# Patient Record
Sex: Female | Born: 1960 | Race: White | Hispanic: No | Smoking: Never smoker
Health system: Southern US, Community
[De-identification: ages and names within clinical notes are randomized; demographics above are authoritative.]

## PROBLEM LIST (undated history)

## (undated) DIAGNOSIS — R51 Headache: Secondary | ICD-10-CM

## (undated) DIAGNOSIS — I10 Essential (primary) hypertension: Secondary | ICD-10-CM

## (undated) DIAGNOSIS — E785 Hyperlipidemia, unspecified: Secondary | ICD-10-CM

## (undated) DIAGNOSIS — K219 Gastro-esophageal reflux disease without esophagitis: Secondary | ICD-10-CM

## (undated) DIAGNOSIS — M1990 Unspecified osteoarthritis, unspecified site: Secondary | ICD-10-CM

## (undated) HISTORY — PX: WISDOM TOOTH EXTRACTION: SHX21

## (undated) HISTORY — PX: EYE SURGERY: SHX253

---

## 1997-12-10 ENCOUNTER — Other Ambulatory Visit: Admission: RE | Admit: 1997-12-10 | Discharge: 1997-12-10 | Payer: Self-pay | Admitting: *Deleted

## 1997-12-12 ENCOUNTER — Other Ambulatory Visit: Admission: RE | Admit: 1997-12-12 | Discharge: 1997-12-12 | Payer: Self-pay | Admitting: *Deleted

## 1999-02-04 ENCOUNTER — Other Ambulatory Visit: Admission: RE | Admit: 1999-02-04 | Discharge: 1999-02-04 | Payer: Self-pay | Admitting: *Deleted

## 2000-08-19 ENCOUNTER — Other Ambulatory Visit: Admission: RE | Admit: 2000-08-19 | Discharge: 2000-08-19 | Payer: Self-pay | Admitting: Obstetrics and Gynecology

## 2000-12-01 ENCOUNTER — Ambulatory Visit (HOSPITAL_COMMUNITY): Admission: RE | Admit: 2000-12-01 | Discharge: 2000-12-01 | Payer: Self-pay | Admitting: Obstetrics and Gynecology

## 2000-12-01 ENCOUNTER — Encounter: Payer: Self-pay | Admitting: Obstetrics and Gynecology

## 2000-12-07 ENCOUNTER — Other Ambulatory Visit: Admission: RE | Admit: 2000-12-07 | Discharge: 2000-12-07 | Payer: Self-pay | Admitting: Obstetrics and Gynecology

## 2000-12-07 ENCOUNTER — Encounter (INDEPENDENT_AMBULATORY_CARE_PROVIDER_SITE_OTHER): Payer: Self-pay

## 2002-03-09 ENCOUNTER — Other Ambulatory Visit: Admission: RE | Admit: 2002-03-09 | Discharge: 2002-03-09 | Payer: Self-pay | Admitting: Obstetrics and Gynecology

## 2003-03-15 ENCOUNTER — Other Ambulatory Visit: Admission: RE | Admit: 2003-03-15 | Discharge: 2003-03-15 | Payer: Self-pay | Admitting: Obstetrics and Gynecology

## 2004-04-03 ENCOUNTER — Other Ambulatory Visit: Admission: RE | Admit: 2004-04-03 | Discharge: 2004-04-03 | Payer: Self-pay | Admitting: Obstetrics and Gynecology

## 2005-04-09 ENCOUNTER — Other Ambulatory Visit: Admission: RE | Admit: 2005-04-09 | Discharge: 2005-04-09 | Payer: Self-pay | Admitting: Obstetrics and Gynecology

## 2006-06-03 ENCOUNTER — Other Ambulatory Visit: Admission: RE | Admit: 2006-06-03 | Discharge: 2006-06-03 | Payer: Self-pay | Admitting: Obstetrics and Gynecology

## 2009-10-02 ENCOUNTER — Encounter: Admission: RE | Admit: 2009-10-02 | Discharge: 2009-10-02 | Payer: Self-pay | Admitting: Obstetrics and Gynecology

## 2010-11-23 ENCOUNTER — Other Ambulatory Visit: Payer: Self-pay | Admitting: Obstetrics and Gynecology

## 2010-11-23 DIAGNOSIS — Z1231 Encounter for screening mammogram for malignant neoplasm of breast: Secondary | ICD-10-CM

## 2010-12-01 ENCOUNTER — Ambulatory Visit
Admission: RE | Admit: 2010-12-01 | Discharge: 2010-12-01 | Disposition: A | Discharge status: Home / Self Care | Payer: BC Managed Care – PPO | Source: Ambulatory Visit | Attending: Obstetrics and Gynecology | Admitting: Obstetrics and Gynecology

## 2010-12-01 DIAGNOSIS — Z1231 Encounter for screening mammogram for malignant neoplasm of breast: Secondary | ICD-10-CM

## 2011-11-02 ENCOUNTER — Other Ambulatory Visit: Payer: Self-pay | Admitting: Obstetrics and Gynecology

## 2011-11-02 DIAGNOSIS — Z1231 Encounter for screening mammogram for malignant neoplasm of breast: Secondary | ICD-10-CM

## 2011-12-14 ENCOUNTER — Ambulatory Visit
Admission: RE | Admit: 2011-12-14 | Discharge: 2011-12-14 | Disposition: A | Discharge status: Home / Self Care | Payer: BC Managed Care – PPO | Source: Ambulatory Visit | Attending: Obstetrics and Gynecology | Admitting: Obstetrics and Gynecology

## 2011-12-14 DIAGNOSIS — Z1231 Encounter for screening mammogram for malignant neoplasm of breast: Secondary | ICD-10-CM

## 2012-08-04 ENCOUNTER — Encounter (HOSPITAL_COMMUNITY): Payer: Self-pay

## 2012-08-07 ENCOUNTER — Encounter (HOSPITAL_COMMUNITY): Payer: Self-pay

## 2012-08-07 ENCOUNTER — Encounter (HOSPITAL_COMMUNITY)
Admission: RE | Admit: 2012-08-07 | Discharge: 2012-08-07 | Disposition: A | Discharge status: Home / Self Care | Payer: BC Managed Care – PPO | Source: Ambulatory Visit | Attending: Obstetrics and Gynecology | Admitting: Obstetrics and Gynecology

## 2012-08-07 ENCOUNTER — Other Ambulatory Visit: Payer: Self-pay

## 2012-08-07 HISTORY — DX: Gastro-esophageal reflux disease without esophagitis: K21.9

## 2012-08-07 HISTORY — DX: Unspecified osteoarthritis, unspecified site: M19.90

## 2012-08-07 HISTORY — DX: Headache: R51

## 2012-08-07 HISTORY — DX: Essential (primary) hypertension: I10

## 2012-08-07 HISTORY — DX: Hyperlipidemia, unspecified: E78.5

## 2012-08-07 LAB — BASIC METABOLIC PANEL
CO2: 20 mEq/L (ref 19–32)
Calcium: 9.6 mg/dL (ref 8.4–10.5)
GFR calc non Af Amer: >90 mL/min (ref >90)
Sodium: 139 mEq/L (ref 135–145)

## 2012-08-07 LAB — CBC
Platelets: 337 10*3/uL (ref 150–400)
RBC: 4.48 MIL/uL (ref 3.87–5.11)
WBC: 7.4 10*3/uL (ref 4.0–10.5)

## 2012-08-07 NOTE — Patient Instructions (Addendum)
   Your procedure is scheduled on:  Friday, Dec 20  Enter through the [employer] of [facility] at: [employer] up the phone at the desk and dial [phone] and inform us of your arrival.  Please call this number if you have any problems the morning of surgery: [phone]  Remember: Do not eat food after midnight: Thursday Do not drink clear liquids after: midnight Thursday Take these medicines the morning of surgery with a SIP OF WATER:  [place],  LO/OVRAL   Do not wear jewelry, make-up, or FINGER nail polish No metal in your hair or on your body. Do not wear lotions, powders, perfumes. You may wear deodorant.  Please use your CHG wash as directed prior to surgery.  Do not shave anywhere for at least 12 hours prior to first CHG shower.  Do not bring valuables to the hospital. Contacts, dentures or bridgework may not be worn into surgery.  Leave suitcase in the car. After Surgery it may be brought to your room. For patients being admitted to the hospital, checkout time is 11:00am the day of discharge.  Patients discharged on the day of surgery will not be allowed to drive home.  Home with husband Molly Maduro.

## 2012-08-17 NOTE — H&P (Signed)
Monica Shaw is an 51 y.o. female G0 who presents for hysteroscopy/polypectomy and Novasure for ongoing problems with menorrhagia and BTB mid cycle.  A recent SIUS showed a 1.7 cm polyp.   The pt refused endometrial biopsy and this will be done with the surgery.  She also requested Novasure ablation for her menorrhagia.    Pertinent Gynecological History: Endometrial polyps Menstrual History:  No LMP recorded.    Past Medical History  Diagnosis Date  . Hypertension   . Hyperlipidemia   . GERD (gastroesophageal reflux disease)   . Headache   . Arthritis     foot, hands    Past Surgical History  Procedure Date  . Wisdom tooth extraction   . Eye surgery     lasik - bilateral    No family history on file.  Social History:  reports that she has never smoked. She has never used smokeless tobacco. She reports that she drinks alcohol. She reports that she does not use illicit drugs.  Allergies:  Allergies  Allergen Reactions  . Ciprofloxacin Rash    No prescriptions prior to admission    ROS  There were no vitals taken for this visit. Physical Exam  Constitutional: She is oriented to person, place, and time. She appears well-developed and well-nourished.  Cardiovascular: Normal rate and regular rhythm.   Respiratory: Effort normal and breath sounds normal.  GI: Soft. Bowel sounds are normal.  Genitourinary: Vagina normal and uterus normal.  Neurological: She is alert and oriented to person, place, and time.  Psychiatric: She has a normal mood and affect. Her behavior is normal.    No results found for this or any previous visit (from the past 24 hour(s)).  No results found.  Assessment/Plan Risks and benefits of procedure were d/w pt in detail including bleeding and uterine perforation.  We discussed it is not ideal to perform endometrial sampling at procedure instead of pre, but she accepts this risk as she cannot tolerate office EMB.  She will use cytotec  preoperatively.    Oliver Pila 08/17/2012, 6:06 PM

## 2012-08-18 ENCOUNTER — Encounter (HOSPITAL_COMMUNITY): Payer: Self-pay

## 2012-08-18 ENCOUNTER — Ambulatory Visit (HOSPITAL_COMMUNITY): Payer: BC Managed Care – PPO

## 2012-08-18 ENCOUNTER — Encounter (HOSPITAL_COMMUNITY)
Admission: RE | Disposition: A | Discharge status: Home / Self Care | Payer: Self-pay | Source: Ambulatory Visit | Attending: Obstetrics and Gynecology

## 2012-08-18 ENCOUNTER — Ambulatory Visit (HOSPITAL_COMMUNITY)
Admission: RE | Admit: 2012-08-18 | Discharge: 2012-08-18 | Disposition: A | Discharge status: Home / Self Care | Payer: BC Managed Care – PPO | Source: Ambulatory Visit | Attending: Obstetrics and Gynecology | Admitting: Obstetrics and Gynecology

## 2012-08-18 DIAGNOSIS — N840 Polyp of corpus uteri: Secondary | ICD-10-CM

## 2012-08-18 DIAGNOSIS — N920 Excessive and frequent menstruation with regular cycle: Secondary | ICD-10-CM | POA: Insufficient documentation

## 2012-08-18 HISTORY — PX: NOVASURE ABLATION: SHX5394

## 2012-08-18 SURGERY — DILATATION & CURETTAGE/HYSTEROSCOPY WITH TRUCLEAR
Anesthesia: General | Site: Uterus | Wound class: Clean Contaminated

## 2012-08-18 MED ORDER — ONDANSETRON HCL 4 MG/2ML IJ SOLN
INTRAMUSCULAR | Status: DC | PRN
  Administered 2012-08-18: 4 mg via INTRAVENOUS

## 2012-08-18 MED ORDER — PROPOFOL 10 MG/ML IV EMUL
INTRAVENOUS | Status: AC
  Filled 2012-08-18: qty 20

## 2012-08-18 MED ORDER — HYDROCODONE-ACETAMINOPHEN 5-500 MG PO TABS: 1.0000 | ORAL_TABLET | Freq: Four times a day (QID) | ORAL | Status: DC | PRN

## 2012-08-18 MED ORDER — LIDOCAINE HCL (PF) 1 % IJ SOLN
INTRAMUSCULAR | Status: DC | PRN
  Administered 2012-08-18: 20 mL

## 2012-08-18 MED ORDER — LIDOCAINE HCL (CARDIAC) 20 MG/ML IV SOLN
INTRAVENOUS | Status: AC
  Filled 2012-08-18: qty 5

## 2012-08-18 MED ORDER — FENTANYL CITRATE 0.05 MG/ML IJ SOLN
INTRAMUSCULAR | Status: AC
  Filled 2012-08-18: qty 2

## 2012-08-18 MED ORDER — LIDOCAINE HCL (CARDIAC) 20 MG/ML IV SOLN
INTRAVENOUS | Status: DC | PRN
  Administered 2012-08-18 (×2): 20 mg via INTRAVENOUS

## 2012-08-18 MED ORDER — SODIUM CHLORIDE 0.9 % IR SOLN
Status: DC | PRN
  Administered 2012-08-18: 3000 mL

## 2012-08-18 MED ORDER — LACTATED RINGERS IV SOLN
INTRAVENOUS | Status: DC
  Administered 2012-08-18 (×2): via INTRAVENOUS

## 2012-08-18 MED ORDER — ONDANSETRON HCL 4 MG/2ML IJ SOLN
INTRAMUSCULAR | Status: AC
  Filled 2012-08-18: qty 2

## 2012-08-18 MED ORDER — MIDAZOLAM HCL 2 MG/2ML IJ SOLN
INTRAMUSCULAR | Status: AC
  Filled 2012-08-18: qty 2

## 2012-08-18 MED ORDER — PROPOFOL 10 MG/ML IV EMUL
INTRAVENOUS | Status: DC | PRN
  Administered 2012-08-18: 160 mg via INTRAVENOUS

## 2012-08-18 MED ORDER — SILVER NITRATE-POT NITRATE 75-25 % EX MISC
CUTANEOUS | Status: DC | PRN
  Administered 2012-08-18: 2

## 2012-08-18 MED ORDER — KETOROLAC TROMETHAMINE 30 MG/ML IJ SOLN
INTRAMUSCULAR | Status: DC | PRN
  Administered 2012-08-18: 30 mg via INTRAVENOUS

## 2012-08-18 MED ORDER — MIDAZOLAM HCL 5 MG/5ML IJ SOLN
INTRAMUSCULAR | Status: DC | PRN
  Administered 2012-08-18 (×2): 1 mg via INTRAVENOUS

## 2012-08-18 MED ORDER — KETOROLAC TROMETHAMINE 30 MG/ML IJ SOLN
INTRAMUSCULAR | Status: AC
  Filled 2012-08-18: qty 1

## 2012-08-18 MED ORDER — FENTANYL CITRATE 0.05 MG/ML IJ SOLN
INTRAMUSCULAR | Status: DC | PRN
  Administered 2012-08-18 (×2): 50 ug via INTRAVENOUS

## 2012-08-18 SURGICAL SUPPLY — 15 items
CATH ROBINSON RED A/P 16FR (CATHETERS) ×1 IMPLANT
CLOTH BEACON ORANGE TIMEOUT ST (SAFETY) ×1 IMPLANT
DECANTER SPIKE VIAL GLASS SM (MISCELLANEOUS) ×1 IMPLANT
DRAPE HYSTEROSCOPY (DRAPE) ×1 IMPLANT
GLOVE BIO SURGEON STRL SZ 6.5 (GLOVE) ×2 IMPLANT
GLOVE BIOGEL PI IND STRL 7.0 (GLOVE) IMPLANT
GLOVE BIOGEL PI INDICATOR 7.0 (GLOVE) ×1
GLOVE ECLIPSE 6.5 STRL STRAW (GLOVE) ×2 IMPLANT
GOWN STRL REIN XL XLG (GOWN DISPOSABLE) ×3 IMPLANT
NDL SPNL 22GX3.5 QUINCKE BK (NEEDLE) IMPLANT
NEEDLE SPNL 22GX3.5 QUINCKE BK (NEEDLE) ×2 IMPLANT
PACK VAGINAL MINOR WOMEN LF (CUSTOM PROCEDURE TRAY) ×1 IMPLANT
PAD OB MATERNITY 4.3X12.25 (PERSONAL CARE ITEMS) ×1 IMPLANT
SYR CONTROL 10ML LL (SYRINGE) ×1 IMPLANT
TOWEL OR 17X24 6PK STRL BLUE (TOWEL DISPOSABLE) ×2 IMPLANT

## 2012-08-18 NOTE — Anesthesia Postprocedure Evaluation (Signed)
Anesthesia Post Note  Patient: Monica Shaw  Procedure(s) Performed: Procedure(s) (LRB): DILATATION & CURETTAGE/HYSTEROSCOPY WITH TRUCLEAR (N/A) NOVASURE ABLATION (N/A)  Anesthesia type: General  Patient location: PACU  Post pain: Pain level controlled  Post assessment: Post-op Vital signs reviewed  Last Vitals:  Filed Vitals:   08/18/12 0830  BP: 149/85  Pulse: 86  Temp:   Resp: 16    Post vital signs: Reviewed  Level of consciousness: sedated  Complications: No apparent anesthesia complicationsfj

## 2012-08-18 NOTE — Op Note (Signed)
Operative note  Preop diagnosis Menorrhagia Endometrial polyp  Postoperative diagnosis Same  Procedure Hysteroscopy, truclear resection of endometrial polyp, NovaSure ablation  Surgeon Dr. Huel Cote  Anesthesia Paracervical block and IV sedation  Fluids Estimated blood loss less than 50 cc Urine output 75 cc straight catheter prior to procedure IV fluids 700 cc LR Hysteroscopic deficit 60 cc  Findings The endometrial cavity was small consistent with an old pair status of the patient it appeared somewhat atrophic with the exception of a 1.7cm endometrial polyp. The cavity length was 4 cm and the cavity width 2.5 cm.  Specimen Endometrial curettings and polyp sent to pathology  Procedure After informed consent was obtained from the patient she was taken to the operating room where LMA anesthesia was obtained without difficulty. She was then prepped and draped in the normal sterile fashion in the dorsal lithotomy position. An appropriate time out was performed. A speculum was placed within the patient's vagina and the anterior lip injected with 2 cc of 1% plain lidocaine. An additional 9 cc each was placed at 2 and 10:00 for a paracervical block. The cervix was easily dilated do to the patient's Cytotec treatment preoperatively. It was dilated to approximately 17 Pratt dilators. The truclear hysteroscope was then introduced into the uterine cavity and the polyp noted as previously stated extending from the posterior surface of the uterine fundus. The truclear blade was introduced and the polyp resected in its entirety. With this completed the hysteroscope was introduced and endometrial curettings obtained in addition to the polyp specimen and sent to pathology with a small curette. The NovaSure unit was then introduced into the uterine cavity and opened with a cavity width of 2.5 noted. With a cervical seal in place a test was performed and passed. The NovaSure unit was then  activated with a power 55 and a treatment time of minute and 25 seconds. At the conclusion of the treatment the NovaSure was removed and the hysteroscope reintroduced into the cavity. The cavity appeared well treated with all areas blanched and no viable endometrium noted. The hysteroscope and all instruments and sponges were removed from the vagina the tenaculum site was treated with silver nitrate. The patient was awakened and taken to the recovery room in good condition. Again all instruments and sponge counts were correct.

## 2012-08-18 NOTE — Transfer of Care (Signed)
Immediate Anesthesia Transfer of Care Note  Patient: Monica Shaw  Procedure(s) Performed: Procedure(s) (LRB) with comments: DILATATION & CURETTAGE/HYSTEROSCOPY WITH TRUCLEAR (N/A) NOVASURE ABLATION (N/A)  Patient Location: PACU  Anesthesia Type:General  Level of Consciousness: awake, alert  and patient cooperative  Airway & Oxygen Therapy: Patient Spontanous Breathing and Patient connected to nasal cannula oxygen  Post-op Assessment: Report given to PACU RN and Post -op Vital signs reviewed and stable  Post vital signs: Reviewed and stable  Complications: No apparent anesthesia complications

## 2012-08-18 NOTE — Progress Notes (Signed)
Patient ID: Monica Shaw, female   DOB: 1961-08-20, 51 y.o.   MRN: [id number] Per pt no changes in dictated H&P, brief exam WNL

## 2012-08-18 NOTE — Brief Op Note (Signed)
08/18/2012  8:09 AM  PATIENT:  Monica Shaw  51 y.o. female  PRE-OPERATIVE DIAGNOSIS:  menorrhagia, polyp  POST-OPERATIVE DIAGNOSIS:  menorrhagia, polyp  PROCEDURE:  Procedure(s) (LRB) with comments: DILATATION & CURETTAGE/HYSTEROSCOPY WITH TRUCLEAR (N/A) NOVASURE ABLATION (N/A)  SURGEON:  Surgeon(s) and Role:    * Oliver Pila, MD - Primary  ANESTHESIA:   local and IV sedation  EBL:  Total I/O In: 1100 [I.V.:1100] Out: 75 [Urine:75]  BLOOD ADMINISTERED:none  DRAINS: none   LOCAL MEDICATIONS USED:  LIDOCAINE (1% plain)  SPECIMEN: polyp and endometrial currettings  DISPOSITION OF SPECIMEN:  PATHOLOGY  COUNTS:  YES  TOURNIQUET:  * No tourniquets in log *  DICTATION: .Dragon Dictation  PLAN OF CARE: Discharge to home after PACU  PATIENT DISPOSITION:  PACU - hemodynamically stable.

## 2012-08-18 NOTE — Anesthesia Preprocedure Evaluation (Signed)
Anesthesia Evaluation  Patient identified by MRN, date of birth, ID band Patient awake    Reviewed: Allergy & Precautions, H&P , Patient's Chart, lab work & pertinent test results, reviewed documented beta blocker date and time   Airway Mallampati: II TM Distance: >3 FB Neck ROM: full    Dental No notable dental hx.    Pulmonary  breath sounds clear to auscultation  Pulmonary exam normal       Cardiovascular hypertension, On Medications Rhythm:regular Rate:Normal     Neuro/Psych    GI/Hepatic GERD-  ,  Endo/Other    Renal/GU      Musculoskeletal   Abdominal   Peds  Hematology   Anesthesia Other Findings   Reproductive/Obstetrics                           Anesthesia Physical Anesthesia Plan  ASA: II  Anesthesia Plan: General   Post-op Pain Management:    Induction: Intravenous  Airway Management Planned: Oral ETT and LMA  Additional Equipment:   Intra-op Plan:   Post-operative Plan:   Informed Consent: I have reviewed the patients History and Physical, chart, labs and discussed the procedure including the risks, benefits and alternatives for the proposed anesthesia with the patient or authorized representative who has indicated his/her understanding and acceptance.   Dental Advisory Given and Dental advisory given  Plan Discussed with: CRNA and Surgeon  Anesthesia Plan Comments: (  Discussed  general anesthesia, including possible nausea, instrumentation of airway, sore throat,pulmonary aspiration, etc. I asked if the were any outstanding questions, or  concerns before we proceeded. )        Anesthesia Quick Evaluation

## 2012-08-21 ENCOUNTER — Encounter (HOSPITAL_COMMUNITY): Payer: Self-pay | Admitting: Obstetrics and Gynecology

## 2012-11-24 ENCOUNTER — Other Ambulatory Visit: Payer: Self-pay

## 2012-11-24 DIAGNOSIS — Z1231 Encounter for screening mammogram for malignant neoplasm of breast: Secondary | ICD-10-CM

## 2012-12-19 ENCOUNTER — Ambulatory Visit
Admission: RE | Admit: 2012-12-19 | Discharge: 2012-12-19 | Disposition: A | Discharge status: Home / Self Care | Payer: BC Managed Care – PPO | Source: Ambulatory Visit

## 2012-12-19 DIAGNOSIS — Z1231 Encounter for screening mammogram for malignant neoplasm of breast: Secondary | ICD-10-CM

## 2013-06-21 ENCOUNTER — Other Ambulatory Visit: Payer: Self-pay | Admitting: Gastroenterology

## 2013-06-21 DIAGNOSIS — R7989 Other specified abnormal findings of blood chemistry: Secondary | ICD-10-CM

## 2013-06-27 ENCOUNTER — Other Ambulatory Visit: Payer: BC Managed Care – PPO

## 2014-04-26 ENCOUNTER — Other Ambulatory Visit: Payer: Self-pay

## 2014-04-26 DIAGNOSIS — Z1231 Encounter for screening mammogram for malignant neoplasm of breast: Secondary | ICD-10-CM

## 2014-05-08 ENCOUNTER — Encounter (INDEPENDENT_AMBULATORY_CARE_PROVIDER_SITE_OTHER): Payer: Self-pay

## 2014-05-08 ENCOUNTER — Ambulatory Visit
Admission: RE | Admit: 2014-05-08 | Discharge: 2014-05-08 | Disposition: A | Discharge status: Home / Self Care | Payer: BC Managed Care – PPO | Source: Ambulatory Visit | Attending: Gastroenterology | Admitting: Gastroenterology

## 2014-05-08 DIAGNOSIS — R945 Abnormal results of liver function studies: Principal | ICD-10-CM

## 2014-05-08 DIAGNOSIS — R7989 Other specified abnormal findings of blood chemistry: Secondary | ICD-10-CM

## 2014-05-16 ENCOUNTER — Ambulatory Visit: Payer: BC Managed Care – PPO

## 2014-05-16 ENCOUNTER — Ambulatory Visit
Admission: RE | Admit: 2014-05-16 | Discharge: 2014-05-16 | Disposition: A | Discharge status: Home / Self Care | Payer: BC Managed Care – PPO | Source: Ambulatory Visit

## 2014-05-16 DIAGNOSIS — Z1231 Encounter for screening mammogram for malignant neoplasm of breast: Secondary | ICD-10-CM

## 2015-06-26 ENCOUNTER — Other Ambulatory Visit: Payer: Self-pay | Admitting: Gastroenterology

## 2015-10-10 ENCOUNTER — Other Ambulatory Visit: Payer: Self-pay

## 2015-10-10 DIAGNOSIS — Z1231 Encounter for screening mammogram for malignant neoplasm of breast: Secondary | ICD-10-CM

## 2015-10-21 ENCOUNTER — Ambulatory Visit
Admission: RE | Admit: 2015-10-21 | Discharge: 2015-10-21 | Disposition: A | Discharge status: Home / Self Care | Payer: BLUE CROSS/BLUE SHIELD | Source: Ambulatory Visit

## 2015-10-21 DIAGNOSIS — Z1231 Encounter for screening mammogram for malignant neoplasm of breast: Secondary | ICD-10-CM

## 2015-12-10 ENCOUNTER — Other Ambulatory Visit: Payer: Self-pay | Admitting: Family Medicine

## 2015-12-10 ENCOUNTER — Other Ambulatory Visit (HOSPITAL_COMMUNITY)
Admission: RE | Admit: 2015-12-10 | Discharge: 2015-12-10 | Disposition: A | Discharge status: Home / Self Care | Payer: BLUE CROSS/BLUE SHIELD | Source: Ambulatory Visit | Attending: Family Medicine | Admitting: Family Medicine

## 2015-12-10 DIAGNOSIS — E782 Mixed hyperlipidemia: Secondary | ICD-10-CM | POA: Diagnosis not present

## 2015-12-10 DIAGNOSIS — I10 Essential (primary) hypertension: Secondary | ICD-10-CM | POA: Diagnosis not present

## 2015-12-10 DIAGNOSIS — Z01419 Encounter for gynecological examination (general) (routine) without abnormal findings: Secondary | ICD-10-CM | POA: Insufficient documentation

## 2015-12-10 DIAGNOSIS — Z124 Encounter for screening for malignant neoplasm of cervix: Secondary | ICD-10-CM | POA: Diagnosis not present

## 2015-12-10 DIAGNOSIS — Z0000 Encounter for general adult medical examination without abnormal findings: Secondary | ICD-10-CM | POA: Diagnosis not present

## 2015-12-10 DIAGNOSIS — K219 Gastro-esophageal reflux disease without esophagitis: Secondary | ICD-10-CM | POA: Diagnosis not present

## 2015-12-12 LAB — CYTOLOGY - PAP

## 2016-02-10 DIAGNOSIS — R748 Abnormal levels of other serum enzymes: Secondary | ICD-10-CM | POA: Diagnosis not present

## 2016-02-16 DIAGNOSIS — R7309 Other abnormal glucose: Secondary | ICD-10-CM | POA: Diagnosis not present

## 2016-06-14 DIAGNOSIS — E782 Mixed hyperlipidemia: Secondary | ICD-10-CM | POA: Diagnosis not present

## 2016-06-14 DIAGNOSIS — E119 Type 2 diabetes mellitus without complications: Secondary | ICD-10-CM | POA: Diagnosis not present

## 2016-06-14 DIAGNOSIS — I10 Essential (primary) hypertension: Secondary | ICD-10-CM | POA: Diagnosis not present

## 2016-06-14 DIAGNOSIS — Z23 Encounter for immunization: Secondary | ICD-10-CM | POA: Diagnosis not present

## 2016-12-09 ENCOUNTER — Other Ambulatory Visit: Payer: Self-pay | Admitting: Family Medicine

## 2016-12-09 DIAGNOSIS — Z1231 Encounter for screening mammogram for malignant neoplasm of breast: Secondary | ICD-10-CM

## 2016-12-29 ENCOUNTER — Ambulatory Visit
Admission: RE | Admit: 2016-12-29 | Discharge: 2016-12-29 | Disposition: A | Discharge status: Home / Self Care | Payer: BLUE CROSS/BLUE SHIELD | Source: Ambulatory Visit | Attending: Family Medicine | Admitting: Family Medicine

## 2016-12-29 DIAGNOSIS — Z1231 Encounter for screening mammogram for malignant neoplasm of breast: Secondary | ICD-10-CM | POA: Diagnosis not present

## 2017-01-03 DIAGNOSIS — E782 Mixed hyperlipidemia: Secondary | ICD-10-CM | POA: Diagnosis not present

## 2017-01-03 DIAGNOSIS — Z0000 Encounter for general adult medical examination without abnormal findings: Secondary | ICD-10-CM | POA: Diagnosis not present

## 2017-01-03 DIAGNOSIS — E119 Type 2 diabetes mellitus without complications: Secondary | ICD-10-CM | POA: Diagnosis not present

## 2017-01-03 DIAGNOSIS — K7581 Nonalcoholic steatohepatitis (NASH): Secondary | ICD-10-CM | POA: Diagnosis not present

## 2017-01-03 DIAGNOSIS — I10 Essential (primary) hypertension: Secondary | ICD-10-CM | POA: Diagnosis not present

## 2017-06-30 DIAGNOSIS — M542 Cervicalgia: Secondary | ICD-10-CM | POA: Diagnosis not present

## 2017-06-30 DIAGNOSIS — M79645 Pain in left finger(s): Secondary | ICD-10-CM | POA: Diagnosis not present

## 2017-07-07 DIAGNOSIS — I10 Essential (primary) hypertension: Secondary | ICD-10-CM | POA: Diagnosis not present

## 2017-07-07 DIAGNOSIS — E119 Type 2 diabetes mellitus without complications: Secondary | ICD-10-CM | POA: Diagnosis not present

## 2017-07-07 DIAGNOSIS — Z23 Encounter for immunization: Secondary | ICD-10-CM | POA: Diagnosis not present

## 2017-07-07 DIAGNOSIS — E782 Mixed hyperlipidemia: Secondary | ICD-10-CM | POA: Diagnosis not present

## 2017-08-20 DIAGNOSIS — J0100 Acute maxillary sinusitis, unspecified: Secondary | ICD-10-CM | POA: Diagnosis not present

## 2017-08-20 DIAGNOSIS — J209 Acute bronchitis, unspecified: Secondary | ICD-10-CM | POA: Diagnosis not present

## 2018-01-06 ENCOUNTER — Other Ambulatory Visit: Payer: Self-pay | Admitting: Family Medicine

## 2018-01-06 DIAGNOSIS — Z1231 Encounter for screening mammogram for malignant neoplasm of breast: Secondary | ICD-10-CM

## 2018-01-25 ENCOUNTER — Ambulatory Visit
Admission: RE | Admit: 2018-01-25 | Discharge: 2018-01-25 | Disposition: A | Discharge status: Home / Self Care | Payer: BLUE CROSS/BLUE SHIELD | Source: Ambulatory Visit | Attending: Family Medicine | Admitting: Family Medicine

## 2018-01-25 DIAGNOSIS — Z1231 Encounter for screening mammogram for malignant neoplasm of breast: Secondary | ICD-10-CM

## 2018-01-31 DIAGNOSIS — E1169 Type 2 diabetes mellitus with other specified complication: Secondary | ICD-10-CM | POA: Diagnosis not present

## 2018-01-31 DIAGNOSIS — E782 Mixed hyperlipidemia: Secondary | ICD-10-CM | POA: Diagnosis not present

## 2018-01-31 DIAGNOSIS — Z0000 Encounter for general adult medical examination without abnormal findings: Secondary | ICD-10-CM | POA: Diagnosis not present

## 2018-01-31 DIAGNOSIS — N63 Unspecified lump in unspecified breast: Secondary | ICD-10-CM | POA: Diagnosis not present

## 2018-01-31 DIAGNOSIS — I10 Essential (primary) hypertension: Secondary | ICD-10-CM | POA: Diagnosis not present

## 2018-02-08 ENCOUNTER — Other Ambulatory Visit: Payer: Self-pay | Admitting: Family Medicine

## 2018-02-08 DIAGNOSIS — N63 Unspecified lump in unspecified breast: Secondary | ICD-10-CM

## 2018-02-14 ENCOUNTER — Ambulatory Visit
Admission: RE | Admit: 2018-02-14 | Discharge: 2018-02-14 | Disposition: A | Discharge status: Home / Self Care | Payer: BLUE CROSS/BLUE SHIELD | Source: Ambulatory Visit | Attending: Family Medicine | Admitting: Family Medicine

## 2018-02-14 DIAGNOSIS — N63 Unspecified lump in unspecified breast: Secondary | ICD-10-CM

## 2018-02-14 DIAGNOSIS — R928 Other abnormal and inconclusive findings on diagnostic imaging of breast: Secondary | ICD-10-CM | POA: Diagnosis not present

## 2018-02-14 DIAGNOSIS — N6489 Other specified disorders of breast: Secondary | ICD-10-CM | POA: Diagnosis not present

## 2018-05-11 DIAGNOSIS — Z79899 Other long term (current) drug therapy: Secondary | ICD-10-CM | POA: Diagnosis not present

## 2018-05-11 DIAGNOSIS — E782 Mixed hyperlipidemia: Secondary | ICD-10-CM | POA: Diagnosis not present

## 2018-08-02 DIAGNOSIS — L723 Sebaceous cyst: Secondary | ICD-10-CM | POA: Diagnosis not present

## 2018-08-02 DIAGNOSIS — E782 Mixed hyperlipidemia: Secondary | ICD-10-CM | POA: Diagnosis not present

## 2018-08-02 DIAGNOSIS — I10 Essential (primary) hypertension: Secondary | ICD-10-CM | POA: Diagnosis not present

## 2019-02-22 ENCOUNTER — Other Ambulatory Visit: Payer: Self-pay | Admitting: Family Medicine

## 2019-02-22 ENCOUNTER — Other Ambulatory Visit (HOSPITAL_COMMUNITY)
Admission: RE | Admit: 2019-02-22 | Discharge: 2019-02-22 | Disposition: A | Discharge status: Home / Self Care | Payer: BC Managed Care – PPO | Source: Ambulatory Visit | Attending: Family Medicine | Admitting: Family Medicine

## 2019-02-22 DIAGNOSIS — Z124 Encounter for screening for malignant neoplasm of cervix: Secondary | ICD-10-CM | POA: Diagnosis not present

## 2019-02-22 DIAGNOSIS — E1169 Type 2 diabetes mellitus with other specified complication: Secondary | ICD-10-CM | POA: Diagnosis not present

## 2019-02-22 DIAGNOSIS — Z0000 Encounter for general adult medical examination without abnormal findings: Secondary | ICD-10-CM | POA: Diagnosis not present

## 2019-02-22 DIAGNOSIS — E782 Mixed hyperlipidemia: Secondary | ICD-10-CM | POA: Diagnosis not present

## 2019-02-22 DIAGNOSIS — Z8249 Family history of ischemic heart disease and other diseases of the circulatory system: Secondary | ICD-10-CM | POA: Diagnosis not present

## 2019-02-22 DIAGNOSIS — I10 Essential (primary) hypertension: Secondary | ICD-10-CM | POA: Diagnosis not present

## 2019-02-22 DIAGNOSIS — K7581 Nonalcoholic steatohepatitis (NASH): Secondary | ICD-10-CM | POA: Diagnosis not present

## 2019-02-26 LAB — CYTOLOGY - PAP: HPV: NOT DETECTED

## 2019-03-26 ENCOUNTER — Other Ambulatory Visit: Payer: Self-pay | Admitting: Family Medicine

## 2019-03-26 DIAGNOSIS — Z1231 Encounter for screening mammogram for malignant neoplasm of breast: Secondary | ICD-10-CM

## 2019-03-27 ENCOUNTER — Ambulatory Visit
Admission: RE | Admit: 2019-03-27 | Discharge: 2019-03-27 | Disposition: A | Discharge status: Home / Self Care | Payer: BLUE CROSS/BLUE SHIELD | Source: Ambulatory Visit | Attending: Family Medicine | Admitting: Family Medicine

## 2019-03-27 ENCOUNTER — Other Ambulatory Visit: Payer: Self-pay

## 2019-03-27 DIAGNOSIS — Z1231 Encounter for screening mammogram for malignant neoplasm of breast: Secondary | ICD-10-CM | POA: Diagnosis not present

## 2019-05-10 ENCOUNTER — Ambulatory Visit: Payer: BLUE CROSS/BLUE SHIELD

## 2019-08-09 DIAGNOSIS — I10 Essential (primary) hypertension: Secondary | ICD-10-CM | POA: Diagnosis not present

## 2019-08-09 DIAGNOSIS — E782 Mixed hyperlipidemia: Secondary | ICD-10-CM | POA: Diagnosis not present

## 2019-08-09 DIAGNOSIS — E1169 Type 2 diabetes mellitus with other specified complication: Secondary | ICD-10-CM | POA: Diagnosis not present

## 2019-08-16 DIAGNOSIS — E1169 Type 2 diabetes mellitus with other specified complication: Secondary | ICD-10-CM | POA: Diagnosis not present

## 2020-02-28 ENCOUNTER — Other Ambulatory Visit (HOSPITAL_COMMUNITY)
Admission: RE | Admit: 2020-02-28 | Discharge: 2020-02-28 | Disposition: A | Discharge status: Home / Self Care | Payer: BC Managed Care – PPO | Source: Ambulatory Visit | Attending: Family Medicine | Admitting: Family Medicine

## 2020-02-28 ENCOUNTER — Other Ambulatory Visit: Payer: Self-pay | Admitting: Family Medicine

## 2020-02-28 DIAGNOSIS — Z0000 Encounter for general adult medical examination without abnormal findings: Secondary | ICD-10-CM | POA: Diagnosis not present

## 2020-02-28 DIAGNOSIS — Z124 Encounter for screening for malignant neoplasm of cervix: Secondary | ICD-10-CM | POA: Diagnosis not present

## 2020-02-28 DIAGNOSIS — N952 Postmenopausal atrophic vaginitis: Secondary | ICD-10-CM | POA: Diagnosis not present

## 2020-02-28 DIAGNOSIS — I10 Essential (primary) hypertension: Secondary | ICD-10-CM | POA: Diagnosis not present

## 2020-02-28 DIAGNOSIS — E782 Mixed hyperlipidemia: Secondary | ICD-10-CM | POA: Diagnosis not present

## 2020-02-28 DIAGNOSIS — E1169 Type 2 diabetes mellitus with other specified complication: Secondary | ICD-10-CM | POA: Diagnosis not present

## 2020-02-28 DIAGNOSIS — K219 Gastro-esophageal reflux disease without esophagitis: Secondary | ICD-10-CM | POA: Diagnosis not present

## 2020-03-04 LAB — CYTOLOGY - PAP
Comment: NEGATIVE
Diagnosis: UNDETERMINED — AB
High risk HPV: NEGATIVE

## 2020-03-10 ENCOUNTER — Other Ambulatory Visit: Payer: Self-pay | Admitting: Family Medicine

## 2020-03-10 DIAGNOSIS — Z1231 Encounter for screening mammogram for malignant neoplasm of breast: Secondary | ICD-10-CM

## 2020-03-28 ENCOUNTER — Ambulatory Visit
Admission: RE | Admit: 2020-03-28 | Discharge: 2020-03-28 | Disposition: A | Discharge status: Home / Self Care | Payer: BC Managed Care – PPO | Source: Ambulatory Visit | Attending: Family Medicine | Admitting: Family Medicine

## 2020-03-28 ENCOUNTER — Other Ambulatory Visit: Payer: Self-pay

## 2020-03-28 DIAGNOSIS — Z1231 Encounter for screening mammogram for malignant neoplasm of breast: Secondary | ICD-10-CM | POA: Diagnosis not present

## 2020-04-02 ENCOUNTER — Other Ambulatory Visit: Payer: Self-pay | Admitting: Family Medicine

## 2020-04-02 DIAGNOSIS — R928 Other abnormal and inconclusive findings on diagnostic imaging of breast: Secondary | ICD-10-CM

## 2020-04-11 ENCOUNTER — Ambulatory Visit
Admission: RE | Admit: 2020-04-11 | Discharge: 2020-04-11 | Disposition: A | Discharge status: Home / Self Care | Payer: BC Managed Care – PPO | Source: Ambulatory Visit | Attending: Family Medicine | Admitting: Family Medicine

## 2020-04-11 ENCOUNTER — Other Ambulatory Visit: Payer: Self-pay

## 2020-04-11 ENCOUNTER — Other Ambulatory Visit: Payer: Self-pay | Admitting: Family Medicine

## 2020-04-11 DIAGNOSIS — R928 Other abnormal and inconclusive findings on diagnostic imaging of breast: Secondary | ICD-10-CM

## 2020-04-11 DIAGNOSIS — N6310 Unspecified lump in the right breast, unspecified quadrant: Secondary | ICD-10-CM

## 2020-04-11 DIAGNOSIS — N6011 Diffuse cystic mastopathy of right breast: Secondary | ICD-10-CM | POA: Diagnosis not present

## 2020-08-14 DIAGNOSIS — Z86010 Personal history of colonic polyps: Secondary | ICD-10-CM | POA: Diagnosis not present

## 2020-09-08 DIAGNOSIS — E782 Mixed hyperlipidemia: Secondary | ICD-10-CM | POA: Diagnosis not present

## 2020-09-08 DIAGNOSIS — K219 Gastro-esophageal reflux disease without esophagitis: Secondary | ICD-10-CM | POA: Diagnosis not present

## 2020-09-08 DIAGNOSIS — R5383 Other fatigue: Secondary | ICD-10-CM | POA: Diagnosis not present

## 2020-09-08 DIAGNOSIS — E1169 Type 2 diabetes mellitus with other specified complication: Secondary | ICD-10-CM | POA: Diagnosis not present

## 2020-09-08 DIAGNOSIS — I10 Essential (primary) hypertension: Secondary | ICD-10-CM | POA: Diagnosis not present

## 2020-10-13 DIAGNOSIS — Z01812 Encounter for preprocedural laboratory examination: Secondary | ICD-10-CM | POA: Diagnosis not present

## 2020-10-16 DIAGNOSIS — K640 First degree hemorrhoids: Secondary | ICD-10-CM | POA: Diagnosis not present

## 2020-10-16 DIAGNOSIS — Z86010 Personal history of colonic polyps: Secondary | ICD-10-CM | POA: Diagnosis not present

## 2020-10-17 ENCOUNTER — Ambulatory Visit
Admission: RE | Admit: 2020-10-17 | Discharge: 2020-10-17 | Disposition: A | Discharge status: Home / Self Care | Payer: BC Managed Care – PPO | Source: Ambulatory Visit | Attending: Family Medicine | Admitting: Family Medicine

## 2020-10-17 ENCOUNTER — Other Ambulatory Visit: Payer: Self-pay

## 2020-10-17 DIAGNOSIS — N6011 Diffuse cystic mastopathy of right breast: Secondary | ICD-10-CM | POA: Diagnosis not present

## 2020-10-17 DIAGNOSIS — R922 Inconclusive mammogram: Secondary | ICD-10-CM | POA: Diagnosis not present

## 2020-10-17 DIAGNOSIS — N6310 Unspecified lump in the right breast, unspecified quadrant: Secondary | ICD-10-CM

## 2021-02-03 DIAGNOSIS — N390 Urinary tract infection, site not specified: Secondary | ICD-10-CM | POA: Diagnosis not present

## 2021-02-17 DIAGNOSIS — R059 Cough, unspecified: Secondary | ICD-10-CM | POA: Diagnosis not present

## 2021-02-17 DIAGNOSIS — Z03818 Encounter for observation for suspected exposure to other biological agents ruled out: Secondary | ICD-10-CM | POA: Diagnosis not present

## 2021-02-17 DIAGNOSIS — R350 Frequency of micturition: Secondary | ICD-10-CM | POA: Diagnosis not present

## 2021-02-17 DIAGNOSIS — R509 Fever, unspecified: Secondary | ICD-10-CM | POA: Diagnosis not present

## 2021-02-28 DIAGNOSIS — N3001 Acute cystitis with hematuria: Secondary | ICD-10-CM | POA: Diagnosis not present

## 2021-02-28 DIAGNOSIS — R319 Hematuria, unspecified: Secondary | ICD-10-CM | POA: Diagnosis not present

## 2021-03-03 ENCOUNTER — Emergency Department (HOSPITAL_BASED_OUTPATIENT_CLINIC_OR_DEPARTMENT_OTHER)
Admission: EM | Admit: 2021-03-03 | Discharge: 2021-03-04 | Disposition: A | Discharge status: Home / Self Care | Payer: BC Managed Care – PPO | Attending: Emergency Medicine | Admitting: Emergency Medicine

## 2021-03-03 ENCOUNTER — Emergency Department (HOSPITAL_BASED_OUTPATIENT_CLINIC_OR_DEPARTMENT_OTHER): Payer: BC Managed Care – PPO | Admitting: Radiology

## 2021-03-03 ENCOUNTER — Emergency Department (HOSPITAL_BASED_OUTPATIENT_CLINIC_OR_DEPARTMENT_OTHER): Payer: BC Managed Care – PPO

## 2021-03-03 ENCOUNTER — Encounter (HOSPITAL_BASED_OUTPATIENT_CLINIC_OR_DEPARTMENT_OTHER): Payer: Self-pay

## 2021-03-03 ENCOUNTER — Other Ambulatory Visit: Payer: Self-pay

## 2021-03-03 DIAGNOSIS — R319 Hematuria, unspecified: Secondary | ICD-10-CM | POA: Diagnosis not present

## 2021-03-03 DIAGNOSIS — Z20822 Contact with and (suspected) exposure to covid-19: Secondary | ICD-10-CM | POA: Diagnosis not present

## 2021-03-03 DIAGNOSIS — Z8616 Personal history of COVID-19: Secondary | ICD-10-CM | POA: Diagnosis not present

## 2021-03-03 DIAGNOSIS — R1030 Lower abdominal pain, unspecified: Secondary | ICD-10-CM | POA: Insufficient documentation

## 2021-03-03 DIAGNOSIS — Z79899 Other long term (current) drug therapy: Secondary | ICD-10-CM | POA: Insufficient documentation

## 2021-03-03 DIAGNOSIS — I10 Essential (primary) hypertension: Secondary | ICD-10-CM | POA: Diagnosis not present

## 2021-03-03 DIAGNOSIS — R509 Fever, unspecified: Secondary | ICD-10-CM | POA: Diagnosis not present

## 2021-03-03 DIAGNOSIS — Z7982 Long term (current) use of aspirin: Secondary | ICD-10-CM | POA: Insufficient documentation

## 2021-03-03 DIAGNOSIS — R059 Cough, unspecified: Secondary | ICD-10-CM | POA: Diagnosis not present

## 2021-03-03 DIAGNOSIS — N200 Calculus of kidney: Secondary | ICD-10-CM | POA: Diagnosis not present

## 2021-03-03 LAB — CBC WITH DIFFERENTIAL/PLATELET
Abs Immature Granulocytes: 0.02 10*3/uL (ref 0.00–0.07)
Basophils Absolute: 0 10*3/uL (ref 0.0–0.1)
Basophils Relative: 0 %
Eosinophils Absolute: 0.1 10*3/uL (ref 0.0–0.5)
Eosinophils Relative: 1 %
HCT: 36.9 % (ref 36.0–46.0)
Hemoglobin: 12.8 g/dL (ref 12.0–15.0)
Immature Granulocytes: 0 %
Lymphocytes Relative: 4 %
Lymphs Abs: 0.3 10*3/uL — ABNORMAL LOW (ref 0.7–4.0)
MCH: 29.6 pg (ref 26.0–34.0)
MCHC: 34.7 g/dL (ref 30.0–36.0)
MCV: 85.4 fL (ref 80.0–100.0)
Monocytes Absolute: 0.3 10*3/uL (ref 0.1–1.0)
Monocytes Relative: 5 %
Neutro Abs: 5.6 10*3/uL (ref 1.7–7.7)
Neutrophils Relative %: 90 %
Platelets: 151 10*3/uL (ref 150–400)
RBC: 4.32 MIL/uL (ref 3.87–5.11)
RDW: 13.1 % (ref 11.5–15.5)
WBC: 6.4 10*3/uL (ref 4.0–10.5)
nRBC: 0 % (ref 0.0–0.2)

## 2021-03-03 LAB — COMPREHENSIVE METABOLIC PANEL
ALT: 51 U/L — ABNORMAL HIGH (ref 0–44)
AST: 41 U/L (ref 15–41)
Albumin: 4.3 g/dL (ref 3.5–5.0)
Alkaline Phosphatase: 74 U/L (ref 38–126)
Anion gap: 13 (ref 5–15)
BUN: 8 mg/dL (ref 6–20)
CO2: 26 mmol/L (ref 22–32)
Calcium: 9.4 mg/dL (ref 8.9–10.3)
Chloride: 97 mmol/L — ABNORMAL LOW (ref 98–111)
Creatinine, Ser: 0.56 mg/dL (ref 0.44–1.00)
GFR, Estimated: >60 mL/min (ref >60)
Glucose, Bld: 157 mg/dL — ABNORMAL HIGH (ref 70–99)
Potassium: 3 mmol/L — ABNORMAL LOW (ref 3.5–5.1)
Sodium: 136 mmol/L (ref 135–145)
Total Bilirubin: 2.7 mg/dL — ABNORMAL HIGH (ref 0.3–1.2)
Total Protein: 7.6 g/dL (ref 6.5–8.1)

## 2021-03-03 LAB — URINALYSIS, ROUTINE W REFLEX MICROSCOPIC
Bilirubin Urine: NEGATIVE
Glucose, UA: NEGATIVE mg/dL
Hgb urine dipstick: NEGATIVE
Ketones, ur: NEGATIVE mg/dL
Leukocytes,Ua: NEGATIVE
Nitrite: NEGATIVE
Protein, ur: NEGATIVE mg/dL
Specific Gravity, Urine: <1.005 — ABNORMAL LOW (ref 1.005–1.030)
pH: 6.5 (ref 5.0–8.0)

## 2021-03-03 LAB — LACTIC ACID, PLASMA
Lactic Acid, Venous: 2.1 mmol/L (ref 0.5–1.9)
Lactic Acid, Venous: 2 mmol/L (ref 0.5–1.9)

## 2021-03-03 LAB — RESP PANEL BY RT-PCR (FLU A&B, COVID) ARPGX2
Influenza A by PCR: NEGATIVE
Influenza B by PCR: NEGATIVE
SARS Coronavirus 2 by RT PCR: NEGATIVE

## 2021-03-03 MED ORDER — ACETAMINOPHEN 500 MG PO TABS
1000.0000 mg | ORAL_TABLET | Freq: Once | ORAL | Status: AC
  Administered 2021-03-04: 1000 mg via ORAL
  Filled 2021-03-03: qty 2

## 2021-03-03 MED ORDER — LACTATED RINGERS IV BOLUS
1000.0000 mL | Freq: Once | INTRAVENOUS | Status: AC
  Administered 2021-03-03: 1000 mL via INTRAVENOUS

## 2021-03-03 MED ORDER — POTASSIUM CHLORIDE CRYS ER 20 MEQ PO TBCR
40.0000 meq | EXTENDED_RELEASE_TABLET | Freq: Once | ORAL | Status: AC
  Administered 2021-03-03: 40 meq via ORAL
  Filled 2021-03-03: qty 2

## 2021-03-03 NOTE — ED Triage Notes (Addendum)
Pt has seen been treated for UTI x 3 weeks and has seen multiple providers. Pt been through 3 rounds of abx with initial symptomatic relief followed by a return of symptoms. Pt symptoms are hematuria, pelvic pain, body aches. Pt has been on Bactrim and Macrobid. Pt sent here today by PCP for fever of 102.8. Pt last took ibuprofen at 1600.

## 2021-03-03 NOTE — ED Provider Notes (Signed)
[facility] EMERGENCY DEPT Provider Note   CSN: [id number] Arrival date & time: 03/03/21  1927     History Chief Complaint  Patient presents with   Hematuria    Monica Shaw is a 60 y.o. female.  60 year old female with history of hypertension, hyperlipidemia, and GERD who presents with hematuria and fever.  On 5/31, the patient was diagnosed with COVID-19.  She has had an off-and-on lingering cough since then but no other respiratory symptoms and no shortness of breath.  On 6/7, patient began having some urinary frequency and mild discomfort with urination.  She saw her PCP who noted some blood and bacteria in the urine and gave her a course of Bactrim to treat UTI.  Her symptoms seem to resolve but 6 to 7 days after completion of antibiotics, symptoms returned.  She went back to PCP and was given 10 more days of Bactrim.  She took 2 doses of Bactrim and on 6/21, she developed a fever and went back to clinic.  At that time, urine looked okay and she tested negative for COVID and flu.  She was instructed to continue her course of antibiotics which she completed.  She was doing okay until 7/2, when she began noting gross hematuria.  She saw PCP again and they gave her a course of Macrobid which she has been taking.  This morning she woke up and felt hot and had a fever of 101.  She continued throughout the day and her fever worsened to 102.8 at PCP office this afternoon.  She was sent here for further evaluation.  She took Motrin at 4 PM prior to arrival.  She reports that her hematuria has resolved and she denies any significant urinary symptoms but she does note some very mild pelvic cramping that feels like period cramps but she is postmenopausal and has had no vaginal bleeding.  She has had nausea off and on but no vomiting or diarrhea.  She denies any cold symptoms or sick contacts.  No rash or skin changes and no known tick bites.  She denies headache, chest pain, or shortness of  breath.  The history is provided by the patient.  Hematuria      Past Medical History:  Diagnosis Date   Arthritis    foot, hands   GERD (gastroesophageal reflux disease)    Headache(784.0)    Hyperlipidemia    Hypertension     There are no problems to display for this patient.   Past Surgical History:  Procedure Laterality Date   EYE SURGERY     lasik - bilateral   NOVASURE ABLATION  08/18/2012   Procedure: NOVASURE ABLATION;  Surgeon: Oliver Pila, MD;  Location: [facility];  Service: Gynecology;  Laterality: N/A;   WISDOM TOOTH EXTRACTION       OB History   No obstetric history on file.     Family History  Problem Relation Age of Onset   Breast cancer Mother     Social History   Tobacco Use   Smoking status: Never   Smokeless tobacco: Never  Vaping Use   Vaping Use: Never used  Substance Use Topics   Alcohol use: Yes    Comment: rare   Drug use: No    Home Medications Prior to Admission medications   Medication Sig Start Date End Date Taking? Authorizing Provider  acetaminophen (TYLENOL ARTHRITIS PAIN) 650 MG CR tablet Take 1,300 mg by mouth every 8 (eight) hours as needed.  [provider]  aspirin 81 MG tablet Take 81 mg by mouth daily.    [provider]  aspirin-acetaminophen-caffeine (EXCEDRIN MIGRAINE) [phone] MG per tablet Take 2 tablets by mouth every 6 (six) hours as needed. Migraine    [provider]  atorvastatin (LIPITOR) 40 MG tablet Take 40 mg by mouth daily.    [provider]  b complex vitamins tablet Take 1 tablet by mouth daily.    [provider]  BLACK COHOSH PO Take 1 capsule by mouth 2 (two) times daily.    [provider]  Calcium Carbonate-Vitamin D (CALCIUM 600 + D PO) Take 1 tablet by mouth daily.    [provider]  Cholecalciferol (VITAMIN D PO) Take 1 tablet by mouth daily. OTC doesn't remember dose    [provider]  dextromethorphan  (DELSYM) 30 MG/5ML liquid Take 60 mg by mouth 2 (two) times daily.    [provider]  HYDROcodone-acetaminophen (VICODIN) 5-500 MG per tablet Take 1 tablet by mouth every 6 (six) hours as needed for pain. 08/18/12   Huel Cote, MD  lisinopril-hydrochlorothiazide (PRINZIDE,ZESTORETIC) 20-12.5 MG per tablet Take 1 tablet by mouth daily.    [provider]  loratadine (CLARITIN) 10 MG tablet Take 10 mg by mouth daily.    [provider]  Magnesium 250 MG TABS Take 1 tablet by mouth daily.    [provider]  Multiple Vitamin (MULTIVITAMIN WITH MINERALS) TABS Take 1 tablet by mouth daily.    [provider]  Omega-3 Fatty Acids (FISH OIL PO) Take 1 capsule by mouth 2 (two) times daily.    [provider]  Omeprazole-Sodium Bicarbonate (ZEGERID OTC PO) Take 1 tablet by mouth daily. OTC    [provider]  vitamin C (ASCORBIC ACID) 500 MG tablet Take 500 mg by mouth daily.    [provider]  vitamin E 400 UNIT capsule Take 400 Units by mouth daily.    [provider]  Zinc 50 MG TABS Take 1 tablet by mouth daily.    [provider]    Allergies    Ciprofloxacin  Review of Systems   Review of Systems  Genitourinary:  Positive for hematuria.  All other systems reviewed and are negative except that which was mentioned in HPI  Physical Exam Updated Vital Signs BP 130/76   Pulse (!) 102   Temp 99.8 F (37.7 C) (Oral)   Resp 18   Ht 5\' 2"  (1.575 m)   Wt 65.8 kg   LMP 12/13/2012   SpO2 99%   BMI 26.52 kg/m   Physical Exam Constitutional:      General: She is not in acute distress.    Appearance: Normal appearance.  HENT:     Head: Normocephalic and atraumatic.  Eyes:     Conjunctiva/sclera: Conjunctivae normal.  Cardiovascular:     Rate and Rhythm: Normal rate and regular rhythm.     Heart sounds: Normal heart sounds. No murmur heard. Pulmonary:     Effort: Pulmonary effort is normal.      Breath sounds: Normal breath sounds.  Abdominal:     General: Abdomen is flat. Bowel sounds are normal. There is no distension.     Palpations: Abdomen is soft.     Tenderness: There is abdominal tenderness.     Comments: Very mild suprapubic tenderness  Musculoskeletal:     Right lower leg: No edema.     Left lower leg: No edema.  Skin:    General: Skin is warm and dry.  Neurological:     Mental Status: She is alert and oriented to person, place, and time.     Comments: fluent  Psychiatric:        Mood and Affect: Mood normal.        Behavior: Behavior normal.    ED Results / Procedures / Treatments   Labs (all labs ordered are listed, but only abnormal results are displayed) Labs Reviewed  LACTIC ACID, PLASMA - Abnormal; Notable for the following components:      Result Value   Lactic Acid, Venous 2.1 (*)    All other components within normal limits  LACTIC ACID, PLASMA - Abnormal; Notable for the following components:   Lactic Acid, Venous 2.0 (*)    All other components within normal limits  COMPREHENSIVE METABOLIC PANEL - Abnormal; Notable for the following components:   Potassium 3.0 (*)    Chloride 97 (*)    Glucose, Bld 157 (*)    ALT 51 (*)    Total Bilirubin 2.7 (*)    All other components within normal limits  CBC WITH DIFFERENTIAL/PLATELET - Abnormal; Notable for the following components:   Lymphs Abs 0.3 (*)    All other components within normal limits  URINALYSIS, ROUTINE W REFLEX MICROSCOPIC - Abnormal; Notable for the following components:   Specific Gravity, Urine <1.005 (*)    All other components within normal limits  RESP PANEL BY RT-PCR (FLU A&B, COVID) [id number]  URINE CULTURE    EKG None  Radiology DG Chest 2 View  Result Date: 03/03/2021 CLINICAL DATA:  Fever and cough EXAM: CHEST - 2 VIEW COMPARISON:  None. FINDINGS: The heart size and mediastinal contours are within normal limits. Both lungs are clear. The visualized skeletal structures  are unremarkable. IMPRESSION: No active cardiopulmonary disease. Electronically Signed   By: Jasmine Pang M.D.   On: 03/03/2021 20:31   CT Renal Stone Study  Result Date: 03/03/2021 CLINICAL DATA:  Hematuria EXAM: CT ABDOMEN AND PELVIS WITHOUT CONTRAST TECHNIQUE: Multidetector CT imaging of the abdomen and pelvis was performed following the standard protocol without IV contrast. COMPARISON:  Ultrasound 05/08/2014 FINDINGS: Lower chest: No acute abnormality. Hepatobiliary: Hepatic contour appears slightly nodular. No calcified gallstone or biliary dilatation Pancreas: Unremarkable. No pancreatic ductal dilatation or surrounding inflammatory changes. Spleen: Normal in size without focal abnormality. Adrenals/Urinary Tract: Adrenal glands are normal. 5 mm stone in the lower pole of the left kidney. The bladder is normal Stomach/Bowel: Stomach is within normal limits. Appendix appears normal. No evidence of bowel wall thickening, distention, or inflammatory changes. Vascular/Lymphatic: No significant vascular findings are present. No enlarged abdominal or pelvic lymph nodes. Reproductive: Uterus and bilateral adnexa are unremarkable. Other: No abdominal wall hernia or abnormality. No abdominopelvic ascites. Musculoskeletal: No acute or significant osseous findings. IMPRESSION: 1. Negative for hydronephrosis or ureteral stone. 2. Nonobstructing stone in the left kidney. 3. Nodular hepatic contour, raising concern for cirrhosis. Electronically Signed   By: Jasmine Pang M.D.   On: 03/03/2021 22:36    Procedures Procedures   Medications Ordered in ED Medications  acetaminophen (TYLENOL) tablet 1,000 mg (has no administration in time range)  lactated ringers bolus 1,000 mL (1,000 mLs Intravenous New Bag/Given 03/03/21 2255)  potassium chloride SA (KLOR-CON) CR tablet 40 mEq (40 mEq Oral Given 03/03/21 2308)    ED Course  I have reviewed the triage vital signs and the nursing notes.  Pertinent labs & imaging  results that were available during my care of the patient were reviewed by me and considered in my medical decision making (see chart for details).    MDM Rules/Calculators/A&P                          Pleasant and well-appearing on exam, reassuring vital signs temp 99.8.  She had some very mild suprapubic abdominal tenderness.  Because she had COVID 1 month ago, highly doubt that she has reinfection with COVID and she does not have any clear viral URI symptoms.  Chest x-ray is clear.  Labs notable for potassium 3.0, chloride 97, glucose 150, normal creatinine, AST 41, ALT 51, total bilirubin 2.7, normal anion gap, normal CBC.  UA is also normal.  Lactate slightly elevated at 2.1 which may be due to poor intake today. Because of her waxing and waning urinary symptoms, history of hematuria, and current fever, obtain CT renal study.  CT shows no acute findings to explain the patient's symptoms and no explanation for fever.  She has had mild cough, we discussed the possibility of another viral URI aside from COVID.  I have asked her extensive history regarding risk of tick bites and patient denies any known tick bites or potential tick exposures.  With normal LFTs and CBC, will hold off on empiric tickborne illness treatment for now but have recommended PCP follow-up in 24 to 48 hours for reassessment of her symptoms particularly if she continues to run fevers.  Regarding her recent history of hematuria, have referred to [facility] urology for evaluation.  She is otherwise well-appearing with no headache, meningismus, or other complaints to suggest meningitis.  I have extensively reviewed return precautions with her and she voiced understanding. Final Clinical Impression(s) / ED Diagnoses Final diagnoses:  Fever, unspecified fever cause    Rx / DC Orders ED Discharge Orders     None        Rodrigo Mcgranahan, Ambrose Finlandachel Morgan, MD 03/04/21 0002

## 2021-03-05 LAB — URINE CULTURE: Culture: NO GROWTH

## 2021-03-11 ENCOUNTER — Other Ambulatory Visit (HOSPITAL_COMMUNITY)
Admission: RE | Admit: 2021-03-11 | Discharge: 2021-03-11 | Disposition: A | Discharge status: Home / Self Care | Payer: BC Managed Care – PPO | Source: Ambulatory Visit | Attending: Family Medicine | Admitting: Family Medicine

## 2021-03-11 ENCOUNTER — Other Ambulatory Visit: Payer: Self-pay | Admitting: Family Medicine

## 2021-03-11 DIAGNOSIS — K7581 Nonalcoholic steatohepatitis (NASH): Secondary | ICD-10-CM | POA: Diagnosis not present

## 2021-03-11 DIAGNOSIS — Z124 Encounter for screening for malignant neoplasm of cervix: Secondary | ICD-10-CM | POA: Insufficient documentation

## 2021-03-11 DIAGNOSIS — E559 Vitamin D deficiency, unspecified: Secondary | ICD-10-CM | POA: Diagnosis not present

## 2021-03-11 DIAGNOSIS — Z0000 Encounter for general adult medical examination without abnormal findings: Secondary | ICD-10-CM | POA: Diagnosis not present

## 2021-03-11 DIAGNOSIS — I10 Essential (primary) hypertension: Secondary | ICD-10-CM | POA: Diagnosis not present

## 2021-03-11 DIAGNOSIS — E1169 Type 2 diabetes mellitus with other specified complication: Secondary | ICD-10-CM | POA: Diagnosis not present

## 2021-03-11 DIAGNOSIS — E782 Mixed hyperlipidemia: Secondary | ICD-10-CM | POA: Diagnosis not present

## 2021-03-19 LAB — CYTOLOGY - PAP
Comment: NEGATIVE
Diagnosis: NEGATIVE
High risk HPV: NEGATIVE

## 2021-04-10 DIAGNOSIS — N200 Calculus of kidney: Secondary | ICD-10-CM | POA: Diagnosis not present

## 2021-04-10 DIAGNOSIS — Z87440 Personal history of urinary (tract) infections: Secondary | ICD-10-CM | POA: Diagnosis not present

## 2021-04-22 DIAGNOSIS — R748 Abnormal levels of other serum enzymes: Secondary | ICD-10-CM | POA: Diagnosis not present

## 2021-07-29 ENCOUNTER — Other Ambulatory Visit: Payer: Self-pay | Admitting: Family Medicine

## 2021-07-29 DIAGNOSIS — Z1231 Encounter for screening mammogram for malignant neoplasm of breast: Secondary | ICD-10-CM

## 2021-08-04 DIAGNOSIS — M6740 Ganglion, unspecified site: Secondary | ICD-10-CM | POA: Diagnosis not present

## 2021-08-26 ENCOUNTER — Other Ambulatory Visit: Payer: Self-pay

## 2021-08-26 DIAGNOSIS — M67442 Ganglion, left hand: Secondary | ICD-10-CM | POA: Diagnosis not present

## 2021-08-26 DIAGNOSIS — M19042 Primary osteoarthritis, left hand: Secondary | ICD-10-CM | POA: Diagnosis not present

## 2021-09-02 ENCOUNTER — Other Ambulatory Visit: Payer: Self-pay

## 2021-09-02 ENCOUNTER — Ambulatory Visit
Admission: RE | Admit: 2021-09-02 | Discharge: 2021-09-02 | Disposition: A | Discharge status: Home / Self Care | Payer: BC Managed Care – PPO | Source: Ambulatory Visit | Attending: Family Medicine | Admitting: Family Medicine

## 2021-09-02 DIAGNOSIS — Z1231 Encounter for screening mammogram for malignant neoplasm of breast: Secondary | ICD-10-CM

## 2021-09-03 ENCOUNTER — Other Ambulatory Visit: Payer: Self-pay | Admitting: Family Medicine

## 2021-09-03 DIAGNOSIS — R928 Other abnormal and inconclusive findings on diagnostic imaging of breast: Secondary | ICD-10-CM

## 2021-09-16 DIAGNOSIS — I10 Essential (primary) hypertension: Secondary | ICD-10-CM | POA: Diagnosis not present

## 2021-09-16 DIAGNOSIS — E1169 Type 2 diabetes mellitus with other specified complication: Secondary | ICD-10-CM | POA: Diagnosis not present

## 2021-09-16 DIAGNOSIS — E782 Mixed hyperlipidemia: Secondary | ICD-10-CM | POA: Diagnosis not present

## 2021-09-16 DIAGNOSIS — K7581 Nonalcoholic steatohepatitis (NASH): Secondary | ICD-10-CM | POA: Diagnosis not present

## 2021-09-21 ENCOUNTER — Ambulatory Visit: Payer: BC Managed Care – PPO

## 2021-09-21 ENCOUNTER — Ambulatory Visit
Admission: RE | Admit: 2021-09-21 | Discharge: 2021-09-21 | Disposition: A | Discharge status: Home / Self Care | Payer: BC Managed Care – PPO | Source: Ambulatory Visit | Attending: Family Medicine | Admitting: Family Medicine

## 2021-09-21 DIAGNOSIS — R922 Inconclusive mammogram: Secondary | ICD-10-CM | POA: Diagnosis not present

## 2021-09-21 DIAGNOSIS — R928 Other abnormal and inconclusive findings on diagnostic imaging of breast: Secondary | ICD-10-CM

## 2021-10-12 ENCOUNTER — Other Ambulatory Visit: Payer: BC Managed Care – PPO

## 2022-03-15 DIAGNOSIS — E119 Type 2 diabetes mellitus without complications: Secondary | ICD-10-CM | POA: Diagnosis not present

## 2022-03-15 DIAGNOSIS — E782 Mixed hyperlipidemia: Secondary | ICD-10-CM | POA: Diagnosis not present

## 2022-03-15 DIAGNOSIS — Z0000 Encounter for general adult medical examination without abnormal findings: Secondary | ICD-10-CM | POA: Diagnosis not present

## 2022-03-15 DIAGNOSIS — I10 Essential (primary) hypertension: Secondary | ICD-10-CM | POA: Diagnosis not present

## 2022-03-15 DIAGNOSIS — K7581 Nonalcoholic steatohepatitis (NASH): Secondary | ICD-10-CM | POA: Diagnosis not present

## 2022-03-31 IMAGING — MG MM DIGITAL DIAGNOSTIC UNILAT*L* W/ TOMO W/ CAD
4 series · 4 of 12 positions shown · non-contrast
Comparison: Previous exam(s).

CLINICAL DATA: Patient returns after screening evaluation possible
LEFT breast asymmetry. Patient's mother recently passed away from
metastatic breast cancer.

EXAM:
DIGITAL DIAGNOSTIC UNILATERAL LEFT MAMMOGRAM WITH TOMOSYNTHESIS AND
CAD
TECHNIQUE: Left digital diagnostic mammography and breast tomosynthesis was
performed. The images were evaluated with computer-aided detection.

[L MLO synth-2D]
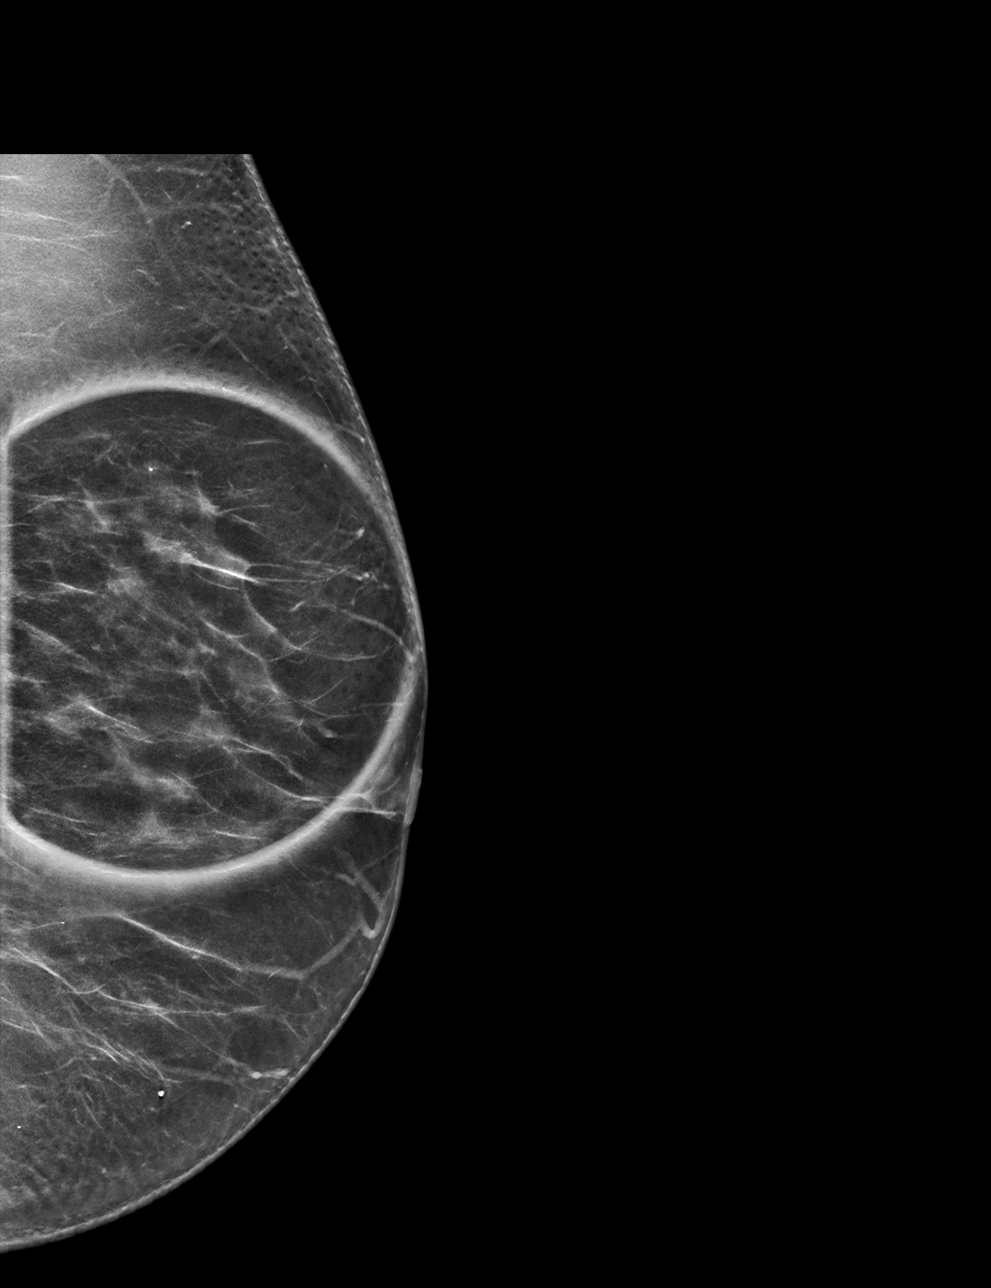

[L ML synth-2D]
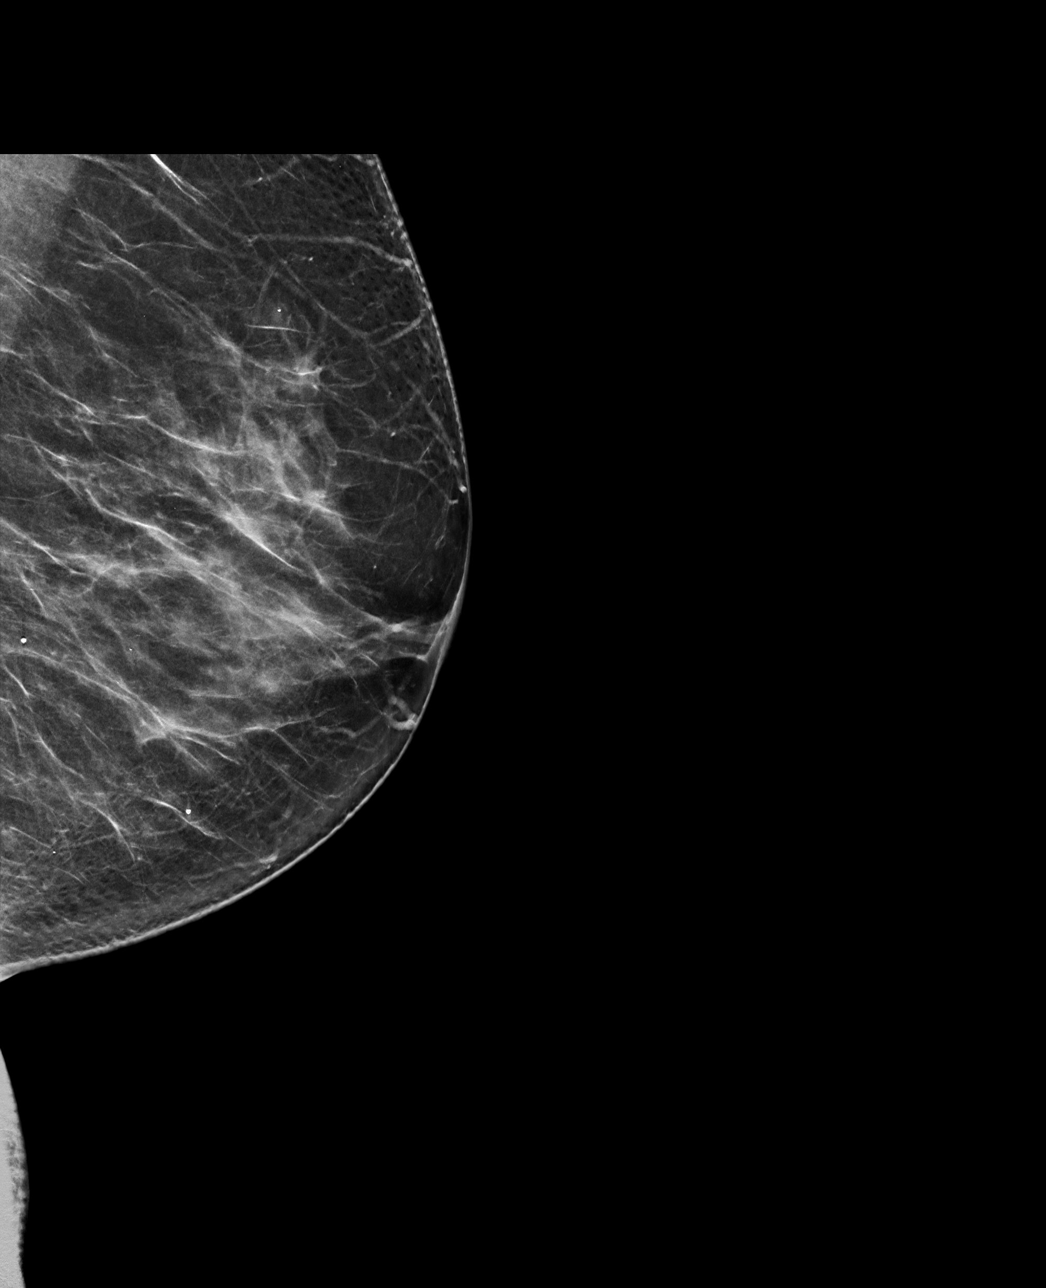

[L MLO tomo · tomo slice 34/67.0]
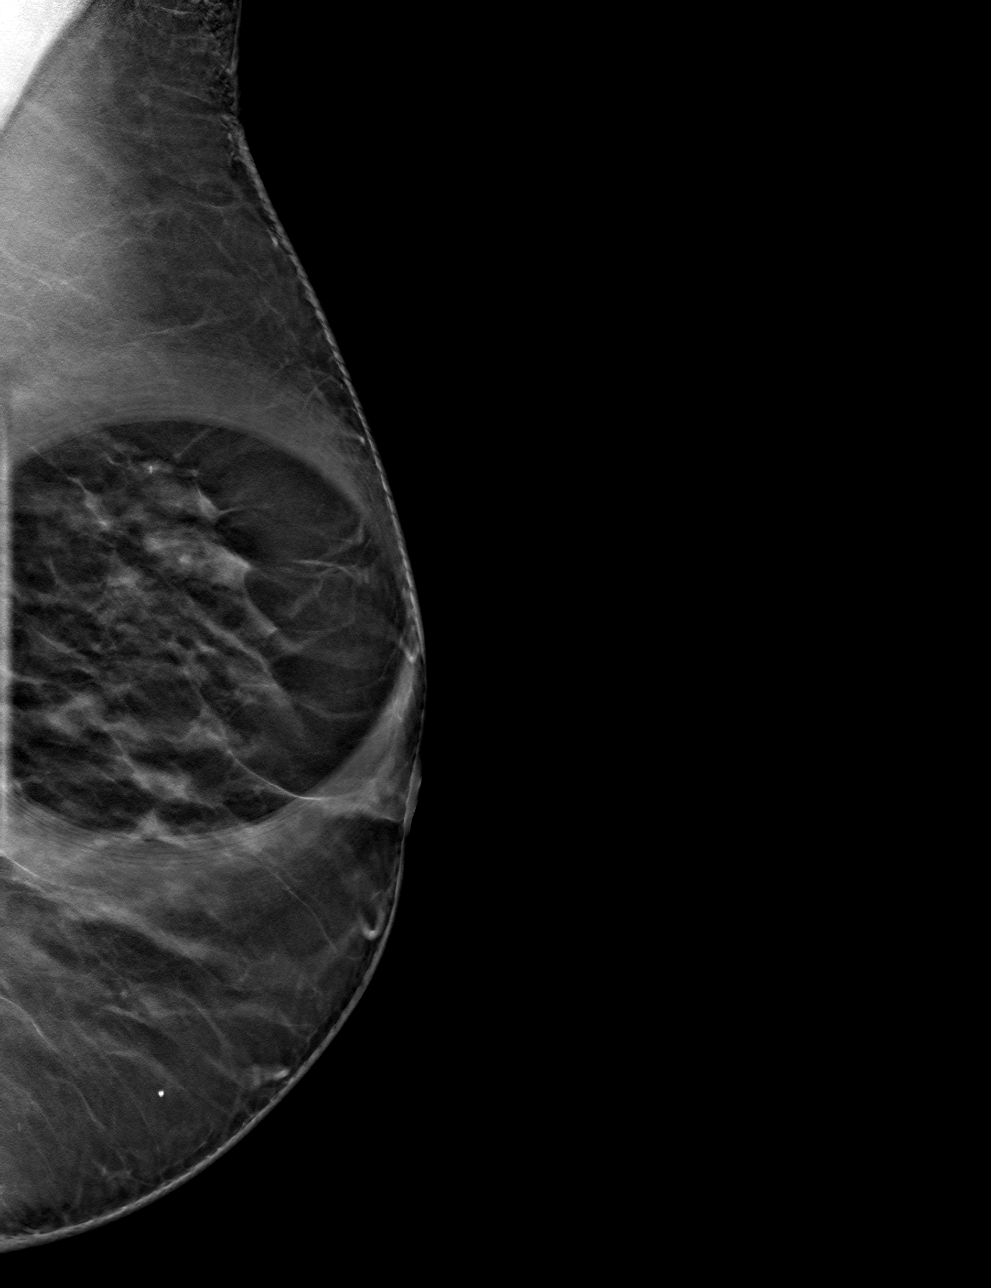

[L ML tomo · tomo slice 39/78.0]
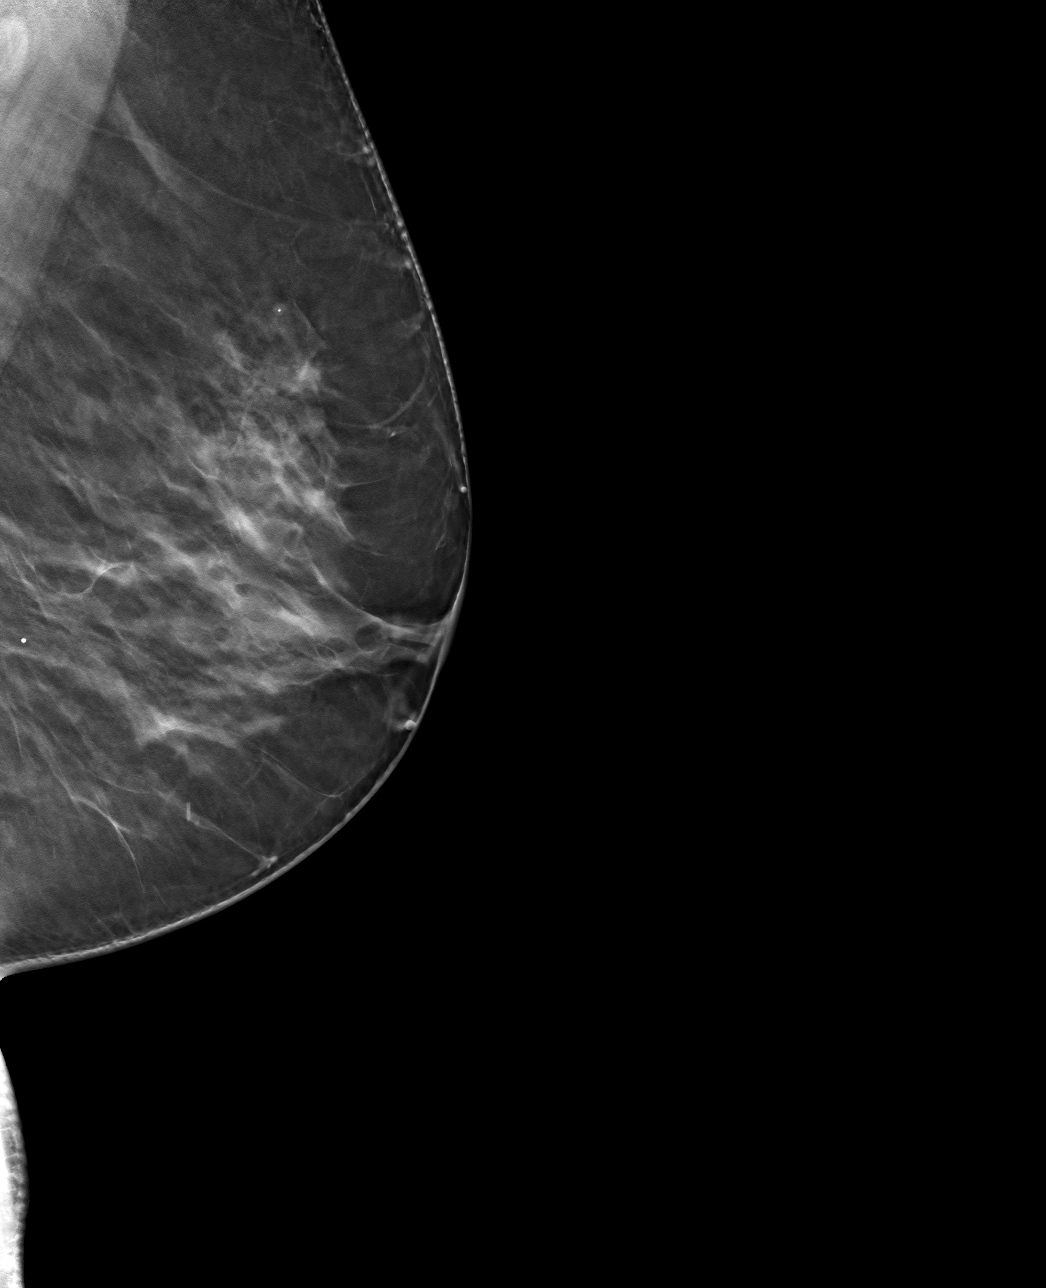

[4 of 12 positions shown; findings below may reference images not displayed]

ACR Breast Density Category c: The breast tissue is heterogeneously
dense, which may obscure small masses.
FINDINGS: Additional 2-D and 3-D images are performed. These views show no
persistent asymmetry in the LEFT breast. No suspicious mass,
distortion, or microcalcifications are identified to suggest
presence of malignancy.
IMPRESSION: No mammographic evidence for malignancy.

RECOMMENDATION:
Screening mammogram in one year.(Code:[id number])

I have discussed the findings and recommendations with the patient.
If applicable, a reminder letter will be sent to the patient
regarding the next appointment.

BI-RADS CATEGORY  1: Negative.

## 2022-08-16 DIAGNOSIS — E119 Type 2 diabetes mellitus without complications: Secondary | ICD-10-CM | POA: Diagnosis not present

## 2022-09-13 ENCOUNTER — Ambulatory Visit
Admission: RE | Admit: 2022-09-13 | Discharge: 2022-09-13 | Disposition: A | Discharge status: Home / Self Care | Payer: BC Managed Care – PPO | Source: Ambulatory Visit

## 2022-09-13 VITALS — BP 151/85 | HR 92 | Temp 98.5°F | Resp 16

## 2022-09-13 DIAGNOSIS — M62838 Other muscle spasm: Secondary | ICD-10-CM

## 2022-09-13 MED ORDER — CYCLOBENZAPRINE HCL 10 MG PO TABS: 10.0000 mg | ORAL_TABLET | Freq: Two times a day (BID) | ORAL | 0 refills | Status: AC | PRN

## 2022-09-13 MED ORDER — PREDNISONE 20 MG PO TABS: 40.0000 mg | ORAL_TABLET | Freq: Every day | ORAL | 0 refills | Status: AC

## 2022-09-13 NOTE — ED Triage Notes (Signed)
Pt c/o right neck pain for several days but says it locked up on sat and was unable to move neck. Says it feels like a leg spasm in her neck. Her sister gave her a muscle relaxer tizanidine that made her sleepy but did not seem to relieve the spasms.

## 2022-09-13 NOTE — ED Provider Notes (Signed)
[facility]    CSN: [id number] Arrival date & time: 09/13/22  1115      History   Chief Complaint Chief Complaint  Patient presents with   Neck Injury    I've been having spasms on the right side of my neck for 2 days. - Entered by patient    HPI Monica Shaw is a 62 y.o. female.   Patient here today for evaluation of right-sided neck pain that is been ongoing for several days.  She states that she has had some stiffness to her neck and states this feels as if it locked up on a few occasions over the last few days.  She denies any known injury to her neck.  She does report that neck pain seems to be worse at nighttime and that she has more spasms at night than during the day.  Her sister did let her have some of the tizanidine she was prescribed which did seem to help somewhat but did make her sleepy.  She notes last night tizanidine did not seem to be as effective.  She denies any numbness or tingling.  The history is provided by the patient.  Neck Injury Pertinent negatives include no abdominal pain.    Past Medical History:  Diagnosis Date   Arthritis    foot, hands   GERD (gastroesophageal reflux disease)    Headache(784.0)    Hyperlipidemia    Hypertension     There are no problems to display for this patient.   Past Surgical History:  Procedure Laterality Date   EYE SURGERY     lasik - bilateral   NOVASURE ABLATION  08/18/2012   Procedure: NOVASURE ABLATION;  Surgeon: Logan Bores, MD;  Location: [facility];  Service: Gynecology;  Laterality: N/A;   WISDOM TOOTH EXTRACTION      OB History   No obstetric history on file.      Home Medications    Prior to Admission medications   Medication Sig Start Date End Date Taking? Authorizing Provider  cyclobenzaprine (FLEXERIL) 10 MG tablet Take 1 tablet (10 mg total) by mouth 2 (two) times daily as needed for muscle spasms. 09/13/22  Yes Francene Finders, PA-C  predniSONE (DELTASONE) 20 MG tablet  Take 2 tablets (40 mg total) by mouth daily with breakfast for 5 days. 09/13/22 09/18/22 Yes Francene Finders, PA-C  acetaminophen (TYLENOL ARTHRITIS PAIN) 650 MG CR tablet Take 1,300 mg by mouth every 8 (eight) hours as needed.    [provider]  aspirin 81 MG tablet Take 81 mg by mouth daily.    [provider]  aspirin-acetaminophen-caffeine (EXCEDRIN MIGRAINE) [phone] MG per tablet Take 2 tablets by mouth every 6 (six) hours as needed. Migraine    [provider]  atorvastatin (LIPITOR) 40 MG tablet Take 40 mg by mouth daily.    [provider]  b complex vitamins tablet Take 1 tablet by mouth daily.    [provider]  BLACK COHOSH PO Take 1 capsule by mouth 2 (two) times daily.    [provider]  Calcium Carbonate-Vitamin D (CALCIUM 600 + D PO) Take 1 tablet by mouth daily.    [provider]  Cholecalciferol (VITAMIN D PO) Take 1 tablet by mouth daily. OTC doesn't remember dose    [provider]  dextromethorphan (DELSYM) 30 MG/5ML liquid Take 60 mg by mouth 2 (two) times daily.    [provider]  hydrochlorothiazide (HYDRODIURIL) 25 MG  tablet Take 25 mg by mouth every morning.    [provider]  HYDROcodone-acetaminophen (VICODIN) 5-500 MG per tablet Take 1 tablet by mouth every 6 (six) hours as needed for pain. 08/18/12   Huel Cote, MD  lisinopril-hydrochlorothiazide (PRINZIDE,ZESTORETIC) 20-12.5 MG per tablet Take 1 tablet by mouth daily.    [provider]  loratadine (CLARITIN) 10 MG tablet Take 10 mg by mouth daily.    [provider]  losartan (COZAAR) 100 MG tablet Take 100 mg by mouth daily.    [provider]  Magnesium 250 MG TABS Take 1 tablet by mouth daily.    [provider]  Multiple Vitamin (MULTIVITAMIN WITH MINERALS) TABS Take 1 tablet by mouth daily.    [provider]  Omega-3 Fatty Acids (FISH OIL PO) Take 1 capsule by mouth  2 (two) times daily.    [provider]  Omeprazole-Sodium Bicarbonate (ZEGERID OTC PO) Take 1 tablet by mouth daily. OTC    [provider]  vitamin C (ASCORBIC ACID) 500 MG tablet Take 500 mg by mouth daily.    [provider]  vitamin E 400 UNIT capsule Take 400 Units by mouth daily.    [provider]  Zinc 50 MG TABS Take 1 tablet by mouth daily.    [provider]    Family History Family History  Problem Relation Age of Onset   Breast cancer Mother     Social History Social History   Tobacco Use   Smoking status: Never   Smokeless tobacco: Never  Vaping Use   Vaping Use: Never used  Substance Use Topics   Alcohol use: Yes    Comment: rare   Drug use: No     Allergies   Ciprofloxacin and Nitrofurantoin   Review of Systems Review of Systems  Constitutional:  Negative for chills and fever.  Eyes:  Negative for discharge and redness.  Gastrointestinal:  Negative for abdominal pain, nausea and vomiting.  Musculoskeletal:  Positive for myalgias and neck pain.  Neurological:  Negative for numbness.     Physical Exam Triage Vital Signs ED Triage Vitals  Enc Vitals Group     BP      Pulse      Resp      Temp      Temp src      SpO2      Weight      Height      Head Circumference      Peak Flow      Pain Score      Pain Loc      Pain Edu?      Excl. in GC?    No data found.  Updated Vital Signs BP (!) 151/85 (BP Location: Right Arm)   Pulse 92   Temp 98.5 F (36.9 C) (Oral)   Resp 16   LMP 12/13/2012   SpO2 98%   Physical Exam Vitals and nursing note reviewed.  Constitutional:      General: She is not in acute distress.    Appearance: Normal appearance. She is not ill-appearing.  HENT:     Head: Normocephalic and atraumatic.  Eyes:     Conjunctiva/sclera: Conjunctivae normal.  Neck:     Comments: Pain noted to right posterior neck with rotation of head to right, normal ROM otherwise. No Midline  C Spine tenderness. Cardiovascular:     Rate and Rhythm: Normal rate.  Pulmonary:     Effort:  Pulmonary effort is normal. No respiratory distress.  Musculoskeletal:     Cervical back: Normal range of motion.  Neurological:     Mental Status: She is alert.  Psychiatric:        Mood and Affect: Mood normal.        Behavior: Behavior normal.        Thought Content: Thought content normal.      UC Treatments / Results  Labs (all labs ordered are listed, but only abnormal results are displayed) Labs Reviewed - No data to display  EKG   Radiology No results found.  Procedures Procedures (including critical care time)  Medications Ordered in UC Medications - No data to display  Initial Impression / Assessment and Plan / UC Course  I have reviewed the triage vital signs and the nursing notes.  Pertinent labs & imaging results that were available during my care of the patient were reviewed by me and considered in my medical decision making (see chart for details).    Suspect muscle strain with associated spasms. Encouraged prednisone and flexeril and follow up with any further concerns or lingering symptoms. Advised flexeril may cause drowsiness and advised to use with caution. Patient expresses understanding.   Final Clinical Impressions(s) / UC Diagnoses   Final diagnoses:  Neck muscle spasm   Discharge Instructions   None    ED Prescriptions     Medication Sig Dispense Auth. Provider   predniSONE (DELTASONE) 20 MG tablet Take 2 tablets (40 mg total) by mouth daily with breakfast for 5 days. 10 tablet Ewell Poe F, PA-C   cyclobenzaprine (FLEXERIL) 10 MG tablet Take 1 tablet (10 mg total) by mouth 2 (two) times daily as needed for muscle spasms. 20 tablet Francene Finders, PA-C      PDMP not reviewed this encounter.   Francene Finders, PA-C 09/13/22 1236

## 2022-10-18 DIAGNOSIS — E782 Mixed hyperlipidemia: Secondary | ICD-10-CM | POA: Diagnosis not present

## 2022-10-18 DIAGNOSIS — E1169 Type 2 diabetes mellitus with other specified complication: Secondary | ICD-10-CM | POA: Diagnosis not present

## 2022-10-18 DIAGNOSIS — K219 Gastro-esophageal reflux disease without esophagitis: Secondary | ICD-10-CM | POA: Diagnosis not present

## 2022-10-18 DIAGNOSIS — I10 Essential (primary) hypertension: Secondary | ICD-10-CM | POA: Diagnosis not present

## 2022-11-08 ENCOUNTER — Other Ambulatory Visit: Payer: Self-pay | Admitting: Family Medicine

## 2022-11-08 DIAGNOSIS — Z1231 Encounter for screening mammogram for malignant neoplasm of breast: Secondary | ICD-10-CM

## 2022-11-11 ENCOUNTER — Ambulatory Visit
Admission: RE | Admit: 2022-11-11 | Discharge: 2022-11-11 | Disposition: A | Discharge status: Home / Self Care | Payer: BC Managed Care – PPO | Source: Ambulatory Visit | Attending: Family Medicine | Admitting: Family Medicine

## 2022-11-11 DIAGNOSIS — Z1231 Encounter for screening mammogram for malignant neoplasm of breast: Secondary | ICD-10-CM

## 2023-03-21 DIAGNOSIS — Z23 Encounter for immunization: Secondary | ICD-10-CM | POA: Diagnosis not present

## 2023-03-21 DIAGNOSIS — E782 Mixed hyperlipidemia: Secondary | ICD-10-CM | POA: Diagnosis not present

## 2023-03-21 DIAGNOSIS — E119 Type 2 diabetes mellitus without complications: Secondary | ICD-10-CM | POA: Diagnosis not present

## 2023-03-21 DIAGNOSIS — I10 Essential (primary) hypertension: Secondary | ICD-10-CM | POA: Diagnosis not present

## 2023-03-21 DIAGNOSIS — Z0000 Encounter for general adult medical examination without abnormal findings: Secondary | ICD-10-CM | POA: Diagnosis not present

## 2023-03-21 DIAGNOSIS — B001 Herpesviral vesicular dermatitis: Secondary | ICD-10-CM | POA: Diagnosis not present

## 2023-03-21 DIAGNOSIS — R252 Cramp and spasm: Secondary | ICD-10-CM | POA: Diagnosis not present

## 2023-08-18 DIAGNOSIS — E119 Type 2 diabetes mellitus without complications: Secondary | ICD-10-CM | POA: Diagnosis not present

## 2023-09-21 DIAGNOSIS — Z23 Encounter for immunization: Secondary | ICD-10-CM | POA: Diagnosis not present

## 2023-09-21 DIAGNOSIS — E1165 Type 2 diabetes mellitus with hyperglycemia: Secondary | ICD-10-CM | POA: Diagnosis not present

## 2023-09-21 DIAGNOSIS — E782 Mixed hyperlipidemia: Secondary | ICD-10-CM | POA: Diagnosis not present

## 2023-09-21 DIAGNOSIS — K7581 Nonalcoholic steatohepatitis (NASH): Secondary | ICD-10-CM | POA: Diagnosis not present

## 2023-09-21 DIAGNOSIS — I10 Essential (primary) hypertension: Secondary | ICD-10-CM | POA: Diagnosis not present

## 2023-10-17 ENCOUNTER — Other Ambulatory Visit: Payer: Self-pay | Admitting: Family Medicine

## 2023-10-17 DIAGNOSIS — Z1231 Encounter for screening mammogram for malignant neoplasm of breast: Secondary | ICD-10-CM

## 2023-11-15 ENCOUNTER — Ambulatory Visit
Admission: RE | Admit: 2023-11-15 | Discharge: 2023-11-15 | Disposition: A | Discharge status: Home / Self Care | Payer: BC Managed Care – PPO | Source: Ambulatory Visit

## 2023-11-15 DIAGNOSIS — Z1231 Encounter for screening mammogram for malignant neoplasm of breast: Secondary | ICD-10-CM | POA: Diagnosis not present

## 2023-12-09 DIAGNOSIS — E1169 Type 2 diabetes mellitus with other specified complication: Secondary | ICD-10-CM | POA: Diagnosis not present

## 2023-12-21 DIAGNOSIS — E1169 Type 2 diabetes mellitus with other specified complication: Secondary | ICD-10-CM | POA: Diagnosis not present

## 2024-04-16 DIAGNOSIS — E782 Mixed hyperlipidemia: Secondary | ICD-10-CM | POA: Diagnosis not present

## 2024-04-16 DIAGNOSIS — I10 Essential (primary) hypertension: Secondary | ICD-10-CM | POA: Diagnosis not present

## 2024-04-16 DIAGNOSIS — K219 Gastro-esophageal reflux disease without esophagitis: Secondary | ICD-10-CM | POA: Diagnosis not present

## 2024-04-16 DIAGNOSIS — Z0000 Encounter for general adult medical examination without abnormal findings: Secondary | ICD-10-CM | POA: Diagnosis not present

## 2024-04-16 DIAGNOSIS — E1169 Type 2 diabetes mellitus with other specified complication: Secondary | ICD-10-CM | POA: Diagnosis not present
